# Patient Record
Sex: Male | Born: 1960 | Race: White | Hispanic: No | Marital: Married | Smoking: Current every day smoker
Health system: Southern US, Community
[De-identification: ages and names within clinical notes are randomized; demographics above are authoritative.]

---

## 2011-12-29 ENCOUNTER — Emergency Department (HOSPITAL_BASED_OUTPATIENT_CLINIC_OR_DEPARTMENT_OTHER): Payer: Worker's Compensation

## 2011-12-29 ENCOUNTER — Encounter (HOSPITAL_BASED_OUTPATIENT_CLINIC_OR_DEPARTMENT_OTHER): Payer: Self-pay | Admitting: *Deleted

## 2011-12-29 ENCOUNTER — Emergency Department (HOSPITAL_BASED_OUTPATIENT_CLINIC_OR_DEPARTMENT_OTHER)
Admission: EM | Admit: 2011-12-29 | Discharge: 2011-12-29 | Disposition: A | Discharge status: Home / Self Care | Payer: Worker's Compensation | Attending: Emergency Medicine | Admitting: Emergency Medicine

## 2011-12-29 DIAGNOSIS — M79609 Pain in unspecified limb: Secondary | ICD-10-CM | POA: Insufficient documentation

## 2011-12-29 DIAGNOSIS — M79673 Pain in unspecified foot: Secondary | ICD-10-CM

## 2011-12-29 MED ORDER — IBUPROFEN 800 MG PO TABS
800.0000 mg | ORAL_TABLET | Freq: Once | ORAL | Status: AC
  Administered 2011-12-29: 800 mg via ORAL

## 2011-12-29 MED ORDER — IBUPROFEN 800 MG PO TABS
ORAL_TABLET | ORAL | Status: AC
  Administered 2011-12-29: 800 mg via ORAL
  Filled 2011-12-29: qty 1

## 2011-12-29 NOTE — ED Notes (Signed)
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 Pain in his left foot.

## 2011-12-29 NOTE — ED Provider Notes (Signed)
History     CSN: [id number]  Arrival date & time 12/29/11  2009   First MD Initiated Contact with Patient 12/29/11 2030      Chief Complaint  Patient presents with  . Foot Pain    (Consider location/radiation/quality/duration/timing/severity/associated sxs/prior treatment) HPI Comments: Pt was at work and a Cabin crew ran over his foot with a cart that had multiple heavy dishes:pt c/o pain to the base of the left great toe  Patient is a 51 y.o. male presenting with lower extremity pain. The history is provided by the patient. No language interpreter was used.  Foot Pain This is a new problem. The current episode started today. The problem occurs constantly. The problem has been unchanged. The symptoms are aggravated by bending. He has tried nothing for the symptoms.    History reviewed. No pertinent past medical history.  History reviewed. No pertinent past surgical history.  No family history on file.  History  Substance Use Topics  . Smoking status: Current Everyday Smoker -- 1.0 packs/day  . Smokeless tobacco: Not on file  . Alcohol Use: No      Review of Systems  Constitutional: Negative.   Respiratory: Negative.   Cardiovascular: Negative.     Allergies  Review of patient's allergies indicates no known allergies.  Home Medications  No current outpatient prescriptions on file.  BP 134/84  Pulse 97  Temp(Src) 98 F (36.7 C) (Oral)  Resp 20  SpO2 98%  Physical Exam  Nursing note and vitals reviewed. Constitutional: He is oriented to person, place, and time. He appears well-developed and well-nourished.  Cardiovascular: Normal rate and regular rhythm.   Pulmonary/Chest: Effort normal and breath sounds normal.  Musculoskeletal: Normal range of motion.       Pt tender at the base of the left great toe  Neurological: He is alert and oriented to person, place, and time.  Skin: Skin is warm and dry.  Psychiatric: He has a normal mood and affect.    ED  Course  Procedures (including critical care time)  Labs Reviewed - No data to display Dg Foot Complete Left  12/29/2011  *RADIOLOGY REPORT*  Clinical Data: The patient's foot was run over by heavy object. Complains of pain near the  first MTP joint.  LEFT FOOT - COMPLETE 3+ VIEW  Comparison: None.  Findings: There is a mild hallux valgus deformity.  Moderate degenerative changes are noted at the first MTP joint.  No fractures or subluxations identified.  A small posterior calcaneal heel spur.  IMPRESSION:  1.  No acute findings.  Original Report Authenticated By: Rosealee Albee, M.D.     1. Foot pain       MDM  No bony abnormality noted:pt has full WUJ:WJXB screen done for workmen's comp        Teressa Lower, NP 12/29/11 2155

## 2012-01-01 NOTE — ED Provider Notes (Signed)
Medical screening examination/treatment/procedure(s) were performed by non-physician practitioner and as supervising physician I was immediately available for consultation/collaboration.  Coalton Arch, MD 01/01/12 0438 

## 2012-02-23 ENCOUNTER — Encounter (HOSPITAL_BASED_OUTPATIENT_CLINIC_OR_DEPARTMENT_OTHER): Payer: Self-pay | Admitting: *Deleted

## 2012-02-23 ENCOUNTER — Emergency Department (HOSPITAL_BASED_OUTPATIENT_CLINIC_OR_DEPARTMENT_OTHER)
Admission: EM | Admit: 2012-02-23 | Discharge: 2012-02-23 | Disposition: A | Discharge status: Home / Self Care | Payer: Worker's Compensation | Attending: Emergency Medicine | Admitting: Emergency Medicine

## 2012-02-23 ENCOUNTER — Emergency Department (HOSPITAL_BASED_OUTPATIENT_CLINIC_OR_DEPARTMENT_OTHER): Payer: Worker's Compensation

## 2012-02-23 DIAGNOSIS — F17200 Nicotine dependence, unspecified, uncomplicated: Secondary | ICD-10-CM | POA: Insufficient documentation

## 2012-02-23 DIAGNOSIS — S060X0A Concussion without loss of consciousness, initial encounter: Secondary | ICD-10-CM

## 2012-02-23 DIAGNOSIS — W2209XA Striking against other stationary object, initial encounter: Secondary | ICD-10-CM | POA: Insufficient documentation

## 2012-02-23 DIAGNOSIS — M542 Cervicalgia: Secondary | ICD-10-CM | POA: Insufficient documentation

## 2012-02-23 MED ORDER — HYDROCODONE-ACETAMINOPHEN 5-325 MG PO TABS: 1.0000 | ORAL_TABLET | ORAL | Status: AC | PRN

## 2012-02-23 NOTE — ED Notes (Signed)
MD at bedside. 

## 2012-02-23 NOTE — ED Notes (Signed)
PA-C at bedside 

## 2012-02-23 NOTE — ED Provider Notes (Signed)
History     CSN: [id number]  Arrival date & time 02/23/12  1654   First MD Initiated Contact with Patient 02/23/12 1822      Chief Complaint  Patient presents with  . Head Injury    (Consider location/radiation/quality/duration/timing/severity/associated sxs/prior treatment) Patient is a 51 y.o. male presenting with head injury. The history is provided by the patient.  Head Injury  The incident occurred 3 to 5 hours ago. He came to the ER via walk-in. The injury mechanism was a direct blow. There was no loss of consciousness. There was no blood loss. The quality of the pain is described as sharp. Pertinent negatives include no vomiting. Associated symptoms comments: He hit his head on a beam while climbing a step ladder. Shortly after hitting his head, he developed a headache and neck pain, and over time the pain has become worse. He reports photophobia. No nausea or visual changes. He states the pain in his neck is beginning to travel down the left arm. Marland Kitchen    History reviewed. No pertinent past medical history.  History reviewed. No pertinent past surgical history.  History reviewed. No pertinent family history.  History  Substance Use Topics  . Smoking status: Current Everyday Smoker -- 1.0 packs/day  . Smokeless tobacco: Not on file  . Alcohol Use: No      Review of Systems  Constitutional: Negative for fever.  HENT: Positive for neck pain.   Eyes: Positive for photophobia. Negative for visual disturbance.  Respiratory: Negative for shortness of breath.   Cardiovascular: Negative for chest pain.  Gastrointestinal: Negative for nausea and vomiting.  Musculoskeletal: Negative for back pain and gait problem.  Neurological: Positive for headaches.  Psychiatric/Behavioral: Negative for confusion.    Allergies  Review of patient's allergies indicates no known allergies.  Home Medications   Current Outpatient Rx  Name Route Sig Dispense Refill  . ACETAMINOPHEN 325 MG  PO TABS Oral Take 1,300 mg by mouth every 6 (six) hours as needed. For pain.      BP 127/77  Pulse 66  Temp 97.8 F (36.6 C) (Oral)  Resp 18  Ht 6\' 4"  (1.93 m)  Wt 225 lb (102.059 kg)  BMI 27.39 kg/m2  SpO2 100%  Physical Exam  Constitutional: He is oriented to person, place, and time. He appears well-developed and well-nourished.  HENT:  Head: Normocephalic and atraumatic.  Eyes: EOM are normal. Pupils are equal, round, and reactive to light.  Neck: Normal range of motion.  Cardiovascular: Normal rate and regular rhythm.   No murmur heard. Pulmonary/Chest: Effort normal and breath sounds normal. He has no wheezes. He has no rales.  Abdominal: Soft. There is no tenderness.  Musculoskeletal: He exhibits no edema.       Midline upper cervical tenderness wtihout swelling. Bilateral paraspinal muscular tenderness, with full range of motion of the neck. Upper extremities with normal grip and full strength with full ROM.   Neurological: He is alert and oriented to person, place, and time. He has normal strength and normal reflexes. No cranial nerve deficit or sensory deficit. He displays a negative Romberg sign.  Psychiatric: He has a normal mood and affect.    ED Course  Procedures (including critical care time)  Labs Reviewed - No data to display Ct Head Wo Contrast  02/23/2012  *RADIOLOGY REPORT*  Clinical Data:  HEAD INJURY.  CT HEAD WITHOUT CONTRAST CT CERVICAL SPINE WITHOUT CONTRAST  Technique:  Multidetector CT imaging of the head and cervical  spine was performed following the standard protocol without IV contrast. Multiplanar CT image reconstructions of the cervical spine were also generated.  Comparison: None  CT HEAD  Findings: There is no evidence of acute intracranial hemorrhage, brain edema, mass lesion, acute infarction,   mass effect, or midline shift. Acute infarct may be inapparent on noncontrast CT. No other intra-axial abnormalities are seen, and the ventricles and sulci  are within normal limits in size and symmetry.   No abnormal extra-axial fluid collections or masses are identified.  No significant calvarial abnormality.  IMPRESSION: 1. Negative for bleed or other acute intracranial process.  CT CERVICAL SPINE  Findings: There is anterior longitudinal ligament calcification at the C5-6 C6-7 interspaces.  Normal alignment.  No prevertebral soft tissue swelling.  Facets seated.  Negative for fracture.  No significant osseous degenerative changes.  Regional soft tissues unremarkable.  IMPRESSION:  1.  No acute abnormality.  Original Report Authenticated By: Osa Craver, M.D.   Ct Cervical Spine Wo Contrast  02/23/2012  *RADIOLOGY REPORT*  Clinical Data:  HEAD INJURY.  CT HEAD WITHOUT CONTRAST CT CERVICAL SPINE WITHOUT CONTRAST  Technique:  Multidetector CT imaging of the head and cervical spine was performed following the standard protocol without IV contrast. Multiplanar CT image reconstructions of the cervical spine were also generated.  Comparison: None  CT HEAD  Findings: There is no evidence of acute intracranial hemorrhage, brain edema, mass lesion, acute infarction,   mass effect, or midline shift. Acute infarct may be inapparent on noncontrast CT. No other intra-axial abnormalities are seen, and the ventricles and sulci are within normal limits in size and symmetry.   No abnormal extra-axial fluid collections or masses are identified.  No significant calvarial abnormality.  IMPRESSION: 1. Negative for bleed or other acute intracranial process.  CT CERVICAL SPINE  Findings: There is anterior longitudinal ligament calcification at the C5-6 C6-7 interspaces.  Normal alignment.  No prevertebral soft tissue swelling.  Facets seated.  Negative for fracture.  No significant osseous degenerative changes.  Regional soft tissues unremarkable.  IMPRESSION:  1.  No acute abnormality.  Original Report Authenticated By: Osa Craver, M.D.     No diagnosis  found.  1. Concussion   MDM  He has remained comfortable and declined pain medication. CT's negative. Discussed pain relief at home. He can be discharged as there is no evidence of intracranial injury.         Rodena Medin, PA-C 02/23/12 2020

## 2012-02-23 NOTE — ED Notes (Signed)
Pt c/o head injury x 1 hr ago  

## 2012-02-24 NOTE — ED Provider Notes (Signed)
Medical screening examination/treatment/procedure(s) were performed by non-physician practitioner and as supervising physician I was immediately available for consultation/collaboration.  Cyndra Numbers, MD 02/24/12 1428

## 2012-04-23 ENCOUNTER — Emergency Department (HOSPITAL_BASED_OUTPATIENT_CLINIC_OR_DEPARTMENT_OTHER): Payer: Worker's Compensation

## 2012-04-23 ENCOUNTER — Emergency Department (HOSPITAL_BASED_OUTPATIENT_CLINIC_OR_DEPARTMENT_OTHER)
Admission: EM | Admit: 2012-04-23 | Discharge: 2012-04-24 | Disposition: A | Discharge status: Home / Self Care | Payer: Worker's Compensation | Attending: Emergency Medicine | Admitting: Emergency Medicine

## 2012-04-23 ENCOUNTER — Encounter (HOSPITAL_BASED_OUTPATIENT_CLINIC_OR_DEPARTMENT_OTHER): Payer: Self-pay | Admitting: Emergency Medicine

## 2012-04-23 DIAGNOSIS — F17200 Nicotine dependence, unspecified, uncomplicated: Secondary | ICD-10-CM | POA: Insufficient documentation

## 2012-04-23 DIAGNOSIS — W2209XA Striking against other stationary object, initial encounter: Secondary | ICD-10-CM | POA: Insufficient documentation

## 2012-04-23 DIAGNOSIS — S7010XA Contusion of unspecified thigh, initial encounter: Secondary | ICD-10-CM | POA: Insufficient documentation

## 2012-04-23 DIAGNOSIS — S8012XA Contusion of left lower leg, initial encounter: Secondary | ICD-10-CM

## 2012-04-23 MED ORDER — IBUPROFEN 800 MG PO TABS: 800.0000 mg | ORAL_TABLET | Freq: Three times a day (TID) | ORAL | Status: AC

## 2012-04-23 NOTE — ED Notes (Signed)
Transported to xray 

## 2012-04-23 NOTE — ED Notes (Signed)
Returned from xray

## 2012-04-23 NOTE — ED Provider Notes (Signed)
History     CSN: [id number]  Arrival date & time 04/23/12  2001   First MD Initiated Contact with Patient 04/23/12 2216      Chief Complaint  Patient presents with  . Leg Injury    (Consider location/radiation/quality/duration/timing/severity/associated sxs/prior treatment) Patient is a 51 y.o. male presenting with leg pain. The history is provided by the patient. No language interpreter was used.  Leg Pain  The incident occurred more than 2 days ago. The incident occurred at work. The injury mechanism was a direct blow.  Pt reports he hit his leg over a counter.   Pt complains of pain in his thigh.   Pt reports tonight he felt like his leg was giving away  History reviewed. No pertinent past medical history.  History reviewed. No pertinent past surgical history.  No family history on file.  History  Substance Use Topics  . Smoking status: Current Every Day Smoker -- 1.0 packs/day  . Smokeless tobacco: Not on file  . Alcohol Use: No      Review of Systems  Musculoskeletal: Positive for myalgias.  All other systems reviewed and are negative.    Allergies  Review of patient's allergies indicates no known allergies.  Home Medications   Current Outpatient Rx  Name Route Sig Dispense Refill  . ACETAMINOPHEN 325 MG PO TABS Oral Take 1,300 mg by mouth every 6 (six) hours as needed. For pain.      BP 120/76  Pulse 86  Temp 98.5 F (36.9 C) (Oral)  Resp 20  Ht 6' 3.75" (1.924 m)  Wt 224 lb (101.606 kg)  BMI 27.45 kg/m2  SpO2 98%  Physical Exam  Nursing note and vitals reviewed. Constitutional: He appears well-developed and well-nourished.  HENT:  Head: Normocephalic and atraumatic.  Eyes: Pupils are equal, round, and reactive to light.  Neck: Normal range of motion.  Musculoskeletal: He exhibits tenderness.       Bruised left thigh,  Tender to palp,  Hip nontender,  Knee nontender,  From  No instabability of knee  Neurological: He is alert.  Skin: Skin is  warm.    ED Course  Procedures (including critical care time)  Labs Reviewed - No data to display No results found.   No diagnosis found.    MDM  Crutches,   Ibuprofen,    Schedule to see Dr. Pearletha Forge for recheck in 3-4 days.         Lonia Skinner [place], [place] 04/23/12 [phone]

## 2012-04-23 NOTE — ED Notes (Signed)
States injured left thigh x 3 days ago at work

## 2012-04-24 NOTE — ED Provider Notes (Signed)
Medical screening examination/treatment/procedure(s) were performed by non-physician practitioner and as supervising physician I was immediately available for consultation/collaboration.   Dione Booze, MD 04/24/12 [phone]

## 2013-10-31 ENCOUNTER — Encounter (HOSPITAL_BASED_OUTPATIENT_CLINIC_OR_DEPARTMENT_OTHER): Payer: Self-pay | Admitting: Emergency Medicine

## 2013-10-31 ENCOUNTER — Emergency Department (HOSPITAL_BASED_OUTPATIENT_CLINIC_OR_DEPARTMENT_OTHER): Payer: No Typology Code available for payment source

## 2013-10-31 ENCOUNTER — Emergency Department (HOSPITAL_BASED_OUTPATIENT_CLINIC_OR_DEPARTMENT_OTHER)
Admission: EM | Admit: 2013-10-31 | Discharge: 2013-10-31 | Disposition: A | Discharge status: Home / Self Care | Payer: No Typology Code available for payment source | Attending: Emergency Medicine | Admitting: Emergency Medicine

## 2013-10-31 DIAGNOSIS — Z791 Long term (current) use of non-steroidal anti-inflammatories (NSAID): Secondary | ICD-10-CM | POA: Insufficient documentation

## 2013-10-31 DIAGNOSIS — M549 Dorsalgia, unspecified: Secondary | ICD-10-CM

## 2013-10-31 DIAGNOSIS — F17200 Nicotine dependence, unspecified, uncomplicated: Secondary | ICD-10-CM | POA: Insufficient documentation

## 2013-10-31 DIAGNOSIS — Y92410 Unspecified street and highway as the place of occurrence of the external cause: Secondary | ICD-10-CM | POA: Insufficient documentation

## 2013-10-31 DIAGNOSIS — Y9389 Activity, other specified: Secondary | ICD-10-CM | POA: Insufficient documentation

## 2013-10-31 DIAGNOSIS — S139XXA Sprain of joints and ligaments of unspecified parts of neck, initial encounter: Secondary | ICD-10-CM | POA: Insufficient documentation

## 2013-10-31 DIAGNOSIS — IMO0002 Reserved for concepts with insufficient information to code with codable children: Secondary | ICD-10-CM | POA: Insufficient documentation

## 2013-10-31 DIAGNOSIS — S0990XA Unspecified injury of head, initial encounter: Secondary | ICD-10-CM | POA: Insufficient documentation

## 2013-10-31 MED ORDER — HYDROCODONE-ACETAMINOPHEN 5-325 MG PO TABS: 1.0000 | ORAL_TABLET | Freq: Four times a day (QID) | ORAL | Status: AC | PRN

## 2013-10-31 MED ORDER — CYCLOBENZAPRINE HCL 10 MG PO TABS: 10.0000 mg | ORAL_TABLET | Freq: Two times a day (BID) | ORAL | Status: AC | PRN
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
## 2013-10-31 NOTE — ED Notes (Signed)
C/o dizziness, denies loc, c/o neck pain, unsure if he hit his head,
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
